# Patient Record
Sex: Female | Born: 1990 | Race: White | Hispanic: No | Marital: Single | Smoking: Never smoker
Health system: Southern US, Community
[De-identification: ages and names within clinical notes are randomized; demographics above are authoritative.]

## PROBLEM LIST (undated history)

## (undated) DIAGNOSIS — F329 Major depressive disorder, single episode, unspecified: Secondary | ICD-10-CM

## (undated) DIAGNOSIS — J349 Unspecified disorder of nose and nasal sinuses: Secondary | ICD-10-CM

## (undated) DIAGNOSIS — F32A Depression, unspecified: Secondary | ICD-10-CM

## (undated) DIAGNOSIS — F419 Anxiety disorder, unspecified: Secondary | ICD-10-CM

## (undated) HISTORY — PX: OTHER SURGICAL HISTORY: SHX169

---

## 2013-10-29 ENCOUNTER — Encounter (HOSPITAL_COMMUNITY): Payer: Self-pay | Admitting: Emergency Medicine

## 2013-10-29 ENCOUNTER — Emergency Department (HOSPITAL_COMMUNITY)
Admission: EM | Admit: 2013-10-29 | Discharge: 2013-10-30 | Disposition: A | Discharge status: Home / Self Care | Payer: No Typology Code available for payment source | Attending: Emergency Medicine | Admitting: Emergency Medicine

## 2013-10-29 DIAGNOSIS — R51 Headache: Secondary | ICD-10-CM | POA: Diagnosis not present

## 2013-10-29 DIAGNOSIS — J320 Chronic maxillary sinusitis: Secondary | ICD-10-CM

## 2013-10-29 DIAGNOSIS — R519 Headache, unspecified: Secondary | ICD-10-CM

## 2013-10-29 DIAGNOSIS — Z8659 Personal history of other mental and behavioral disorders: Secondary | ICD-10-CM | POA: Diagnosis not present

## 2013-10-29 DIAGNOSIS — J0100 Acute maxillary sinusitis, unspecified: Secondary | ICD-10-CM | POA: Insufficient documentation

## 2013-10-29 HISTORY — DX: Anxiety disorder, unspecified: F41.9

## 2013-10-29 HISTORY — DX: Major depressive disorder, single episode, unspecified: F32.9

## 2013-10-29 HISTORY — DX: Depression, unspecified: F32.A

## 2013-10-29 NOTE — ED Notes (Signed)
Pt is c/o sinus problems esp on the left under her eye  Pt states it has been bothering her for the past 2 months

## 2013-10-29 NOTE — ED Provider Notes (Signed)
CSN: [id number]     Arrival date & time 10/29/13  2230 History   First MD Initiated Contact with Patient 10/29/13 2348     Chief Complaint  Patient presents with  . Facial Pain     (Consider location/radiation/quality/duration/timing/severity/associated sxs/prior Treatment) The history is provided by the patient.   23 year old female comes in with left malar pain for the last 2 months. Pain is dull pressure and constant. She relates that 3/10. She thinks it is a sinus infection but there's been no nasal congestion or drainage. She was seen at an urgent care center and put on a course of Augmentin with no improvement. She went to an ENT physician who put her on a different antibiotic which seemed to improve her general physical state but did not really affecting her pressure. On completing a course of antibiotics, all symptoms reverted to baseline. She has noted that she is not able to concentrate as well as she had before and she feels generally fatigued. She had a previous episode of sinus infection which required 3 courses of antibiotics. She denies fever or chills. There's been no cough. He denies sore throat or ear pain.  Past Medical History  Diagnosis Date  . Depression   . Anxiety    Past Surgical History  Procedure Laterality Date  . Extraction of wisdom teeth     Family History  Problem Relation Age of Onset  . Heart Problems Mother   . Depression Other   . Heart Problems Other   . Cancer Other    History  Substance Use Topics  . Smoking status: Never Smoker   . Smokeless tobacco: Not on file  . Alcohol Use: Yes   OB History   Grav Para Term Preterm Abortions TAB SAB Ect Mult Living                 Review of Systems  All other systems reviewed and are negative.     Allergies  Review of patient's allergies indicates no known allergies.  Home Medications   Prior to Admission medications   Not on File   BP 130/79  Pulse 92  Temp(Src) 98.8 F (37.1 C)  (Oral)  Resp 18  SpO2 100%  LMP 10/04/2013 Physical Exam  Nursing note and vitals reviewed.  23 year old female, resting comfortably and in no acute distress. Vital signs are normal. Oxygen saturation is 100%, which is normal. Head is normocephalic and atraumatic. PERRLA, EOMI. Oropharynx is clear. Nasal cavity appears normal. Nasal septum is in the midline. There is no edema around the turbinates and no drainage seen. There is mild tenderness to palpation in the left maxillary sinus but no tenderness on the right or over the frontal sinuses. Neck is nontender and supple without adenopathy or JVD. Back is nontender and there is no CVA tenderness. Lungs are clear without rales, wheezes, or rhonchi. Chest is nontender. Heart has regular rate and rhythm without murmur. Abdomen is soft, flat, nontender without masses or hepatosplenomegaly and peristalsis is normoactive. Extremities have no cyanosis or edema, full range of motion is present. Skin is warm and dry without rash. Neurologic: Mental status is normal, cranial nerves are intact, there are no motor or sensory deficits.  ED Course  Procedures (including critical care time)  Imaging Review Ct Maxillofacial Wo Cm  10/30/2013   CLINICAL DATA:  23 year old female with facial pain and sinus disease. Initial encounter.  EXAM: CT MAXILLOFACIAL WITHOUT CONTRAST  TECHNIQUE: Multidetector CT imaging of  the maxillofacial structures was performed. Multiplanar CT image reconstructions were also generated. A small metallic BB was placed on the right temple in order to reliably differentiate right from left.  COMPARISON:  None.  FINDINGS: Negative visualized non contrast brain parenchyma, orbits soft tissues, scalp soft tissues, and visible non contrast deep soft tissue spaces of the face.  Tympanic cavities and mastoids are clear.  Sphenoid sinuses are clear.  Ethmoid air cells are clear.  Frontal sinuses are clear.  Right maxillary sinus is clear.   Mild mucosal thickening and trace bubbly opacity in the anterior left maxillary sinus. This does affect the left OMC.  Mild nasal septal deviation. No acute osseous abnormality identified.  IMPRESSION: Minimal to mild left maxillary sinus inflammatory changes.   Electronically Signed   By: Augusto GambleLee  Hall M.D.   On: 10/30/2013 00:47   Images viewed by me.  MDM   Final diagnoses:  Facial pain  Left maxillary sinusitis    Left sided facial pain of uncertain cause. She will be sent for a sinus CT scan to evaluate whether she actually does have an acute sinusitis.  CT shows mild inflammatory changes in the left maxillary sinus. This does correlate with where she has having her pain but the CT scan is rather unimpressive. Since she's already had 2 courses of antibiotics, it is elected to refer her back to her ENT physician who can make decision at that time whether to give her an additional course of antibiotics. This was discussed with patient she expresses understanding.  Dione Boozeavid Altamese Deguire, MD 10/30/13 [phone]

## 2013-10-30 ENCOUNTER — Emergency Department (HOSPITAL_COMMUNITY): Payer: No Typology Code available for payment source

## 2013-10-30 NOTE — Discharge Instructions (Signed)
Make an appointment with your ENT physician.  Sinusitis Sinusitis is redness, soreness, and inflammation of the paranasal sinuses. Paranasal sinuses are air pockets within the bones of your face (beneath the eyes, the middle of the forehead, or above the eyes). In healthy paranasal sinuses, mucus is able to drain out, and air is able to circulate through them by way of your nose. However, when your paranasal sinuses are inflamed, mucus and air can become trapped. This can allow bacteria and other germs to grow and cause infection. Sinusitis can develop quickly and last only a short time (acute) or continue over a long period (chronic). Sinusitis that lasts for more than 12 weeks is considered chronic.  CAUSES  Causes of sinusitis include:  Allergies.  Structural abnormalities, such as displacement of the cartilage that separates your nostrils (deviated septum), which can decrease the air flow through your nose and sinuses and affect sinus drainage.  Functional abnormalities, such as when the small hairs (cilia) that line your sinuses and help remove mucus do not work properly or are not present. SIGNS AND SYMPTOMS  Symptoms of acute and chronic sinusitis are the same. The primary symptoms are pain and pressure around the affected sinuses. Other symptoms include:  Upper toothache.  Earache.  Headache.  Bad breath.  Decreased sense of smell and taste.  A cough, which worsens when you are lying flat.  Fatigue.  Fever.  Thick drainage from your nose, which often is green and may contain pus (purulent).  Swelling and warmth over the affected sinuses. DIAGNOSIS  Your health care provider will perform a physical exam. During the exam, your health care provider may:  Look in your nose for signs of abnormal growths in your nostrils (nasal polyps).  Tap over the affected sinus to check for signs of infection.  View the inside of your sinuses (endoscopy) using an imaging device that  has a light attached (endoscope). If your health care provider suspects that you have chronic sinusitis, one or more of the following tests may be recommended:  Allergy tests.  Nasal culture. A sample of mucus is taken from your nose, sent to a lab, and screened for bacteria.  Nasal cytology. A sample of mucus is taken from your nose and examined by your health care provider to determine if your sinusitis is related to an allergy. TREATMENT  Most cases of acute sinusitis are related to a viral infection and will resolve on their own within 10 days. Sometimes medicines are prescribed to help relieve symptoms (pain medicine, decongestants, nasal steroid sprays, or saline sprays).  However, for sinusitis related to a bacterial infection, your health care provider will prescribe antibiotic medicines. These are medicines that will help kill the bacteria causing the infection.  Rarely, sinusitis is caused by a fungal infection. In theses cases, your health care provider will prescribe antifungal medicine. For some cases of chronic sinusitis, surgery is needed. Generally, these are cases in which sinusitis recurs more than 3 times per year, despite other treatments. HOME CARE INSTRUCTIONS   Drink plenty of water. Water helps thin the mucus so your sinuses can drain more easily.  Use a humidifier.  Inhale steam 3 to 4 times a day (for example, sit in the bathroom with the shower running).  Apply a warm, moist washcloth to your face 3 to 4 times a day, or as directed by your health care provider.  Use saline nasal sprays to help moisten and clean your sinuses.  Take medicines only  as directed by your health care provider.  If you were prescribed either an antibiotic or antifungal medicine, finish it all even if you start to feel better. SEEK IMMEDIATE MEDICAL CARE IF:  You have increasing pain or severe headaches.  You have nausea, vomiting, or drowsiness.  You have swelling around your  face.  You have vision problems.  You have a stiff neck.  You have difficulty breathing. MAKE SURE YOU:   Understand these instructions.  Will watch your condition.  Will get help right away if you are not doing well or get worse. Document Released: 12/25/2004 Document Revised: 05/11/2013 Document Reviewed: 01/09/2011 [facility] Patient Information 2015 [employer] [place]. This information is not intended to replace advice given to you by your health care provider. Make sure you discuss any questions you have with your health care provider.

## 2013-11-04 ENCOUNTER — Emergency Department (HOSPITAL_COMMUNITY): Payer: PRIVATE HEALTH INSURANCE

## 2013-11-04 ENCOUNTER — Emergency Department (HOSPITAL_COMMUNITY)
Admission: EM | Admit: 2013-11-04 | Discharge: 2013-11-04 | Disposition: A | Discharge status: Home / Self Care | Payer: PRIVATE HEALTH INSURANCE | Attending: Emergency Medicine | Admitting: Emergency Medicine

## 2013-11-04 ENCOUNTER — Encounter (HOSPITAL_COMMUNITY): Payer: Self-pay | Admitting: Emergency Medicine

## 2013-11-04 DIAGNOSIS — R6883 Chills (without fever): Secondary | ICD-10-CM

## 2013-11-04 DIAGNOSIS — F329 Major depressive disorder, single episode, unspecified: Secondary | ICD-10-CM | POA: Diagnosis not present

## 2013-11-04 DIAGNOSIS — R69 Illness, unspecified: Secondary | ICD-10-CM

## 2013-11-04 DIAGNOSIS — F419 Anxiety disorder, unspecified: Secondary | ICD-10-CM | POA: Insufficient documentation

## 2013-11-04 DIAGNOSIS — J111 Influenza due to unidentified influenza virus with other respiratory manifestations: Secondary | ICD-10-CM | POA: Diagnosis not present

## 2013-11-04 DIAGNOSIS — R05 Cough: Secondary | ICD-10-CM | POA: Diagnosis present

## 2013-11-04 DIAGNOSIS — Z79899 Other long term (current) drug therapy: Secondary | ICD-10-CM | POA: Diagnosis not present

## 2013-11-04 DIAGNOSIS — Z793 Long term (current) use of hormonal contraceptives: Secondary | ICD-10-CM | POA: Diagnosis not present

## 2013-11-04 MED ORDER — ACETAMINOPHEN 500 MG PO TABS
1000.0000 mg | ORAL_TABLET | Freq: Once | ORAL | Status: AC
  Administered 2013-11-04: 1000 mg via ORAL
  Filled 2013-11-04: qty 2

## 2013-11-04 MED ORDER — ONDANSETRON 4 MG PO TBDP: ORAL_TABLET | ORAL | Status: AC

## 2013-11-04 MED ORDER — ONDANSETRON 4 MG PO TBDP
4.0000 mg | ORAL_TABLET | Freq: Once | ORAL | Status: AC
  Administered 2013-11-04: 4 mg via ORAL
  Filled 2013-11-04: qty 1

## 2013-11-04 MED ORDER — SODIUM CHLORIDE 0.9 % IV BOLUS (SEPSIS)
1000.0000 mL | Freq: Once | INTRAVENOUS | Status: AC
  Administered 2013-11-04: 1000 mL via INTRAVENOUS

## 2013-11-04 MED ORDER — KETOROLAC TROMETHAMINE 10 MG PO TABS: 10.0000 mg | ORAL_TABLET | Freq: Four times a day (QID) | ORAL | Status: AC | PRN

## 2013-11-04 MED ORDER — ONDANSETRON HCL 4 MG/2ML IJ SOLN
4.0000 mg | Freq: Once | INTRAMUSCULAR | Status: AC
  Administered 2013-11-04: 4 mg via INTRAVENOUS
  Filled 2013-11-04: qty 2

## 2013-11-04 MED ORDER — KETOROLAC TROMETHAMINE 30 MG/ML IJ SOLN
30.0000 mg | Freq: Once | INTRAMUSCULAR | Status: AC
  Administered 2013-11-04: 30 mg via INTRAVENOUS
  Filled 2013-11-04: qty 1

## 2013-11-04 MED ORDER — GI COCKTAIL ~~LOC~~
30.0000 mL | Freq: Once | ORAL | Status: AC
  Administered 2013-11-04: 30 mL via ORAL
  Filled 2013-11-04: qty 30

## 2013-11-04 NOTE — ED Notes (Signed)
Pt able to tolerate PO challenge

## 2013-11-04 NOTE — ED Notes (Signed)
Bed: WA07 Expected date:  Expected time:  Means of arrival:  Comments: EMS/fever/cough

## 2013-11-04 NOTE — ED Provider Notes (Signed)
CSN: [id number]     Arrival date & time 11/04/13  1810 History   First MD Initiated Contact with Patient 11/04/13 1813     Chief Complaint  Patient presents with  . Fever  . Cough     (Consider location/radiation/quality/duration/timing/severity/associated sxs/prior Treatment) Patient is a 23 y.o. female presenting with fever, cough, and general illness. The history is provided by the patient.  Fever Associated symptoms: cough, headaches and myalgias   Associated symptoms: no chest pain, no rash and no rhinorrhea   Cough Associated symptoms: headaches and myalgias   Associated symptoms: no chest pain, no fever, no rash and no rhinorrhea   Illness Location:  Diffuse Quality:  Myalgias, nausea Severity:  Moderate Onset quality:  Sudden Timing:  Constant Progression:  Unchanged Chronicity:  New Context:  Spontaneously Relieved by:  Nothing Worsened by:  Nothing Associated symptoms: cough, headaches, loss of consciousness and myalgias   Associated symptoms: no abdominal pain, no chest pain, no fatigue, no fever, no rash and no rhinorrhea     Past Medical History  Diagnosis Date  . Depression   . Anxiety    Past Surgical History  Procedure Laterality Date  . Extraction of wisdom teeth     Family History  Problem Relation Age of Onset  . Heart Problems Mother   . Depression Other   . Heart Problems Other   . Cancer Other    History  Substance Use Topics  . Smoking status: Never Smoker   . Smokeless tobacco: Not on file  . Alcohol Use: Yes   OB History   Grav Para Term Preterm Abortions TAB SAB Ect Mult Living                 Review of Systems  Constitutional: Negative for fever and fatigue.  HENT: Negative for rhinorrhea.   Respiratory: Positive for cough.   Cardiovascular: Negative for chest pain.  Gastrointestinal: Negative for abdominal pain.  Musculoskeletal: Positive for myalgias.  Skin: Negative for rash.  Neurological: Positive for loss of  consciousness and headaches.  All other systems reviewed and are negative.     Allergies  Review of patient's allergies indicates no known allergies.  Home Medications   Prior to Admission medications   Medication Sig Start Date End Date Taking? Authorizing Provider  calcium carbonate (OS-CAL) 600 MG TABS tablet Take 600 mg by mouth daily with breakfast.   Yes Historical Provider, MD  Cholecalciferol (VITAMIN D3) 5000 UNITS CAPS Take 1 capsule by mouth daily.   Yes Historical Provider, MD  FLUoxetine (PROZAC) 20 MG capsule Take 60 mg by mouth daily.   Yes Historical Provider, MD  norethindrone-ethinyl estradiol (MICROGESTIN,JUNEL,LOESTRIN) 1-20 MG-MCG tablet Take 1 tablet by mouth daily.   Yes Historical Provider, MD  pseudoephedrine (SUDAFED) 120 MG 12 hr tablet Take 120 mg by mouth 2 (two) times daily.   Yes Historical Provider, MD   BP 116/59  Pulse 60  Temp(Src) 98.6 F (37 C) (Oral)  Resp 18  SpO2 95%  LMP 10/04/2013 Physical Exam  Nursing note and vitals reviewed. Constitutional: She is oriented to person, place, and time. She appears well-developed and well-nourished. No distress.  HENT:  Head: Normocephalic and atraumatic.  Mouth/Throat: Oropharynx is clear and moist.  Eyes: EOM are normal. Pupils are equal, round, and reactive to light.  Neck: Normal range of motion. Neck supple.  Cardiovascular: Normal rate and regular rhythm.  Exam reveals no friction rub.   No murmur heard. Pulmonary/Chest: Effort normal  and breath sounds normal. No respiratory distress. She has no wheezes. She has no rales.  Abdominal: Soft. She exhibits no distension. There is no tenderness. There is no rebound.  Musculoskeletal: Normal range of motion. She exhibits no edema.  Neurological: She is alert and oriented to person, place, and time.  Skin: She is not diaphoretic.    ED Course  Procedures (including critical care time) Labs Revieuw Labs Reviewed - No data to display  Imaging  Review Dg Chest 2 View  11/04/2013   CLINICAL DATA:  Fever. Chills. Nausea and vomiting. Duration: Today.  EXAM: CHEST  2 VIEW  COMPARISON:  None.  FINDINGS: The lungs appear clear.  Cardiac and mediastinal contours normal.  No pleural effusion identified.  IMPRESSION: No active cardiopulmonary disease.   Electronically Signed   By: Herbie BaltimoreWalt  Liebkemann M.D.   On: 11/04/2013 19:20     EKG Interpretation None      MDM   Final diagnoses:  Chills  Influenza-like illness    22F presents with nausea, myalgias, headaches, chills. No fever here. Recent sinusitis, has had 2 courses of antibiotics without relief, was evaluated 6 days ago in the ER, no antibiotics given at that time. Woke up this morning with symptoms. No vomiting, no cough, no sore throat, no URI symptoms. AFVSS here. On exam, neck supple, no meningeal signs. Abdomen benign.  Will treat with nausea meds, fluids, toradol. Concern for possible flu-like illness. Feeling better after fluids, nausea meds. She is tolerating PO. Given zofran, toradol. Stable for discharge.  Elwin MochaBlair Sayeed Weatherall, MD 11/04/13 2231

## 2013-11-04 NOTE — ED Notes (Signed)
Per EMS-pt c/o of flu like symptoms, fever/chills. Nausea, vomiting x2.

## 2013-11-04 NOTE — Discharge Instructions (Signed)
Influenza Influenza ("the flu") is a viral infection of the respiratory tract. It occurs more often in winter months because people spend more time in close contact with one another. Influenza can make you feel very sick. Influenza easily spreads from person to person (contagious). CAUSES  Influenza is caused by a virus that infects the respiratory tract. You can catch the virus by breathing in droplets from an infected person's cough or sneeze. You can also catch the virus by touching something that was recently contaminated with the virus and then touching your mouth, nose, or eyes. RISKS AND COMPLICATIONS You may be at risk for a more severe case of influenza if you smoke cigarettes, have diabetes, have chronic heart disease (such as heart failure) or lung disease (such as asthma), or if you have a weakened immune system. Elderly people and pregnant women are also at risk for more serious infections. The most common problem of influenza is a lung infection (pneumonia). Sometimes, this problem can require emergency medical care and may be life threatening. SIGNS AND SYMPTOMS  Symptoms typically last 4 to 10 days and may include:  Fever.  Chills.  Headache, body aches, and muscle aches.  Sore throat.  Chest discomfort and cough.  Poor appetite.  Weakness or feeling tired.  Dizziness.  Nausea or vomiting. DIAGNOSIS  Diagnosis of influenza is often made based on your history and a physical exam. A nose or throat swab test can be done to confirm the diagnosis. TREATMENT  In mild cases, influenza goes away on its own. Treatment is directed at relieving symptoms. For more severe cases, your health care provider may prescribe antiviral medicines to shorten the sickness. Antibiotic medicines are not effective because the infection is caused by a virus, not by bacteria. HOME CARE INSTRUCTIONS  Take medicines only as directed by your health care provider.  Use a cool mist humidifier to make  breathing easier.  Get plenty of rest until your temperature returns to normal. This usually takes 3 to 4 days.  Drink enough fluid to keep your urine clear or pale yellow.  Cover yourmouth and nosewhen coughing or sneezing,and wash your handswellto prevent thevirusfrom spreading.  Stay homefromwork orschool untilthe fever is gonefor at least 30full day. PREVENTION  An annual influenza vaccination (flu shot) is the best way to avoid getting influenza. An annual flu shot is now routinely recommended for all adults in the [place] IF:  You experiencechest pain, yourcough worsens,or you producemore mucus.  Youhave nausea,vomiting, ordiarrhea.  Your fever returns or gets worse. SEEK IMMEDIATE MEDICAL CARE IF:  You havetrouble breathing, you become short of breath,or your skin ornails becomebluish.  You have severe painor stiffnessin the neck.  You develop a sudden headache, or pain in the face or ear.  You have nausea or vomiting that you cannot control. MAKE SURE YOU:   Understand these instructions.  Will watch your condition.  Will get help right away if you are not doing well or get worse. Document Released: 12/23/1999 Document Revised: 05/11/2013 Document Reviewed: 03/26/2011 [facility] Patient Information 2015 [place], [place]. This information is not intended to replace advice given to you by your health care provider. Make sure you discuss any questions you have with your health care provider.   Emergency Department Resource Guide 1) Find a Doctor and Pay Out of Pocket Although you won't have to find out who is covered by your insurance plan, it is a good idea to ask around and get recommendations. You  will then need to call the office and see if the doctor you have chosen will accept you as a new patient and what types of options they offer for patients who are self-pay. Some doctors offer discounts or will set up payment plans for  their patients who do not have insurance, but you will need to ask so you aren't surprised when you get to your appointment.  2) Contact Your Local Health Department Not all health departments have doctors that can see patients for sick visits, but many do, so it is worth a call to see if yours does. If you don't know where your local health department is, you can check in your phone book. The CDC also has a tool to help you locate your state's health department, and many state websites also have listings of all of their local health departments.  3) Find a [facility] If your illness is not likely to be very severe or complicated, you may want to try a walk in clinic. These are popping up all over the country in pharmacies, drugstores, and shopping centers. They're usually staffed by nurse practitioners or physician assistants that have been trained to treat common illnesses and complaints. They're usually fairly quick and inexpensive. However, if you have serious medical issues or chronic medical problems, these are probably not your best option.  No Primary Care Doctor: - Call Health Connect at  [phone] - they can help you locate a primary care doctor that  accepts your insurance, provides certain services, etc. - Physician Referral Service- [phone]  Chronic Pain Problems: Organization         Address  Phone   Notes  [facility]  [phone] Patients need to be referred by their primary care doctor.   Medication Assistance: Organization         Address  Phone   Notes  [facility] Medication [facility] [address] [place], [place] [address] [phone] --Must be a resident of [facility] -- Must have NO insurance coverage whatsoever (no Medicaid/ Medicare, etc.) -- The pt. MUST have a primary care doctor that directs their care regularly and follows them in the community   MedAssist  [phone]   [employer]  [phone]    Agencies that provide inexpensive medical care: Organization         Address  Phone   Notes  [place]  [phone]   [facility]    [phone]   [facility] [place], [place] [address] [phone]   [place] [address], [place] [phone]   Planned Parenthood    [phone]   [facility]    [phone]   [place] and [place], [place] Phone:  [phone], Fax:  [phone] Hours of Operation:  9 am - 6 pm, M-F.  Also accepts Medicaid/Medicare and self-pay.  [facility] [place], [address], [place] Phone: [phone], Fax: [phone]. Hours of Operation:  8:30 am - 5:30 pm, M-F.  Also accepts Medicaid and self-pay.  [facility] [place] [address] [place] Phone: [phone]   [place], [place], [place] [phone], Ext. [phone] Mondays & Thursdays: 7-9 AM.  First 15 patients are seen on a first come, first  serve basis.    [place] Providers:  Organization         Address  Phone   Notes  [facility] [address], [place] [phone] Also accepts self-pay patients.  [facility] [address], [place]  [phone]   [place], [address], [place] [phone]   [facility] [address], [place] [phone]   Lucianne Lei [address], [place]   [phone] Only accepts [place] Access [place] patients after they have their name applied to their card.   Self-Pay (no insurance) in [facility]:  Organization         Address  Phone   Notes  Sickle Cell Patients, [facility] Internal [facility] [place] [phone]   [facility] [place] [phone]   [facility] [place]  [place], [place], [place] [phone]   [facility]/Dr. Osei-Bonsu  [address], [place] or [address], [place] [phone] Phone number for both [place] and [place] locations is the same.  [facility] [address] [place] [phone]   [facility] [address] [place] or [address] [phone]   [facility] [address] [place] [phone], phone; [phone], fax Sees patients 1st and 3rd Saturday of every month.  Must not qualify for public or private insurance (i.e. Medicaid, Medicare, [place] Health Choice, Veterans' Benefits)  Household income should be no more than 200% of the poverty level The clinic cannot treat you if you are pregnant or think you are pregnant  Sexually transmitted diseases are not treated at the clinic.    Dental Care: Organization         Address  Phone  Notes  [facility] [place] [phone] Accepts children up to age 25 who are enrolled in [place] or [place]; pregnant women with a Medicaid card; and children who have applied for Medicaid or [place] Health Choice, but were declined, whose parents can pay a reduced fee at time of service.  [facility] Department of [facility]  [address], [place] [phone] Accepts children up to age 62 who are enrolled in [place] or [place]; pregnant women with a Medicaid card; and children who have applied for Medicaid or [place] Health Choice, but were declined, whose parents can pay a reduced fee at time of service.  [place] Adult Dental Access PROGRAM  [place] [phone] Patients are  seen by appointment only. Walk-ins are not accepted. [place] will see patients 60 years of age and older. Monday - Tuesday (8am-5pm) Most Wednesdays (8:30-5pm) $30 per visit, cash only  [facility] Adult Dental Access PROGRAM  [address], [facility] [phone] Patients are seen by appointment only. Walk-ins are not accepted. [place] will see patients 21 years of age and older. One Wednesday Evening (Monthly: Volunteer Based).  $30 per visit, cash only  [place]  [phone] for adults; Children under age 84, call Graduate Pediatric Dentistry at [phone]. Children aged 35-14, please call (  [phone]) [phone] to request a pediatric application.  Dental services are provided in all areas of dental care including fillings, crowns and bridges, complete and partial dentures, implants, gum treatment, root canals, and extractions. Preventive care is also provided. Treatment is provided to both adults and children. Patients are selected via a lottery and there is often a waiting list.   [facility] [address], [place]  [phone] [email or web address]   [facility] [address] [place], [place] [phone], Ext. [phone] Second and Fourth Thursday of each month, opens at 6:30 AM; Clinic ends at 9 AM.  Patients are seen on a first-come first-served basis, and a limited number are seen during each clinic.   [facility]  [address] Hillard Danker [place], [place] [phone]   Eligibility Requirements You must have lived in [place], [place], or [place] counties for at least the last three months.   You cannot be eligible for state or federal sponsored [employer], including [employer], [place], or [employer].   You generally cannot be eligible for healthcare insurance through your employer.    How to apply: Eligibility screenings are held every Tuesday and Wednesday afternoon from 1:00 pm until 4:00  pm. You do not need an appointment for the interview!  [facility] [address] [place], [place]   [place]  [place] Department  [place]  [phone]    Behavioral Health Resources in the Community: Intensive Outpatient Programs Organization         Address  Phone  Notes  Seffner Maish Vaya. [address], [place], [place] [phone]   [facility] [address], [place], [place]   ADS: Alcohol & Drug Svcs [address] [place], [place]   [place] [address]  [place], [place] or [phone]   Substance Abuse Resources Organization         Address  Phone  Notes  Alcohol and Drug Services  [phone]   [place]  [phone]   [facility] [place]   Chinita Pester  [phone]   Residential & Outpatient Substance Abuse Program  [phone]   Psychological Services Organization         Address  Phone  Notes  [facility] [place]  [place]  [phone]   [place] [address], [place] or [phone]    Mobile Crisis Teams Organization         Address  Phone  Notes  [facility], Mobile Crisis Care Unit  [phone]   [facility]  [address] [place], [place]   [place] Levels [address] [place] [phone]    Self-Help/Support Groups Organization         Address  Phone             Notes  [place]. of [place] - variety of support groups  [place] Call for more information  Narcotics Anonymous (NA), [facility] [address], [employer] [place]  2 meetings at this location   Special educational needs teacher          Address  Phone  Notes  [facility] [place],    [place]  [phone]   [place]  [id number], [place], [place] [phone]   [facility] [address], [place] [phone] Admissions: 8am-3pm M-F  [facility] [address]    [place], [place] [phone]   [facility] [address], [place], [place] [phone]   [facility] [address]  [place], [place] [phone]   [facility] - Intensive Outpatient [address], [place], [place] [phone]   [facility] (Addiction Recovery Care [facility] [address]  [place], [place] [phone] or [phone]   [facility] Services (RTS) [address] [place], [place] [phone] Accepts Medicaid  [facility] [address]  [place] [phone] Substance Abuse/Addiction Treatment   [facility] Behavioral Health Resources Organization         Address  Phone  Notes  [facility]  [phone]) [phone]   Angie FavaJulie Brannon, PhD [address] [place], [place]   [phone] or [phone]   [facility]   [address] [place], [place] [phone]   [facility] [address], [place], [place] [phone]) [phone] Insurance/Medicaid/sponsorship through [facility]  [facility] [address]                                    [place], [place] [phone]) [phone] Therapy/tele-psych/case  [facility] [address] [place] [address]   [place], [place] [phone]    Dr. Lolly MustacheArfeen  [phone]   [facility]  [employer] [facility] 1) [address], [place] 2) [address], [place] 3)  [address], [place] [phone](336) [phone]  [phone](336) [phone]   [facility] Child Abuse Hotline [phone](336) [phone] or [phone]) [phone] (After Hours)

## 2013-11-19 ENCOUNTER — Encounter (HOSPITAL_COMMUNITY): Payer: Self-pay | Admitting: *Deleted

## 2013-11-19 ENCOUNTER — Emergency Department (HOSPITAL_COMMUNITY)
Admission: EM | Admit: 2013-11-19 | Discharge: 2013-11-20 | Disposition: A | Discharge status: Home / Self Care | Payer: PRIVATE HEALTH INSURANCE | Attending: Emergency Medicine | Admitting: Emergency Medicine

## 2013-11-19 DIAGNOSIS — J320 Chronic maxillary sinusitis: Secondary | ICD-10-CM | POA: Diagnosis not present

## 2013-11-19 DIAGNOSIS — Z79899 Other long term (current) drug therapy: Secondary | ICD-10-CM | POA: Insufficient documentation

## 2013-11-19 DIAGNOSIS — F329 Major depressive disorder, single episode, unspecified: Secondary | ICD-10-CM | POA: Diagnosis not present

## 2013-11-19 DIAGNOSIS — R51 Headache: Secondary | ICD-10-CM | POA: Diagnosis present

## 2013-11-19 DIAGNOSIS — F419 Anxiety disorder, unspecified: Secondary | ICD-10-CM | POA: Insufficient documentation

## 2013-11-19 HISTORY — DX: Unspecified disorder of nose and nasal sinuses: J34.9

## 2013-11-19 MED ORDER — FLUTICASONE PROPIONATE 50 MCG/ACT NA SUSP
2.0000 | Freq: Every day | NASAL | Status: AC
Start: 2013-11-19 — End: ?

## 2013-11-19 MED ORDER — HYDROCODONE-ACETAMINOPHEN 5-325 MG PO TABS: 1.0000 | ORAL_TABLET | Freq: Four times a day (QID) | ORAL | Status: AC | PRN

## 2013-11-19 NOTE — Discharge Instructions (Signed)

## 2013-11-19 NOTE — ED Notes (Signed)
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

## 2013-11-19 NOTE — ED Provider Notes (Signed)
CSN: [id number]     Arrival date & time 11/19/13  2244 History   First MD Initiated Contact with Patient 11/19/13 2325     Chief Complaint  Patient presents with  . Facial Pain     (Consider location/radiation/quality/duration/timing/severity/associated sxs/prior Treatment) HPI  This is a 23 year old female who presents with sinus pressure. Patient reports chronic sinus issues over the last 2-3 months. She was diagnosed with a blockage on CT scan and has sent an ENT. She is trying to schedule surgery but states "it can't happen for weeks." She reports persistent left-sided headache and difficulty breathing out of her left nare.  Patient reports that she is having difficulty breathing at night. She is taking Sudafed q12 hours and nasal saline. She's not currently on any nasal steroids. She denies any fevers.  Past Medical History  Diagnosis Date  . Depression   . Anxiety   . Sinus disease    Past Surgical History  Procedure Laterality Date  . Extraction of wisdom teeth     Family History  Problem Relation Age of Onset  . Heart Problems Mother   . Depression Other   . Heart Problems Other   . Cancer Other    History  Substance Use Topics  . Smoking status: Never Smoker   . Smokeless tobacco: Not on file  . Alcohol Use: Yes   OB History    No data available     Review of Systems  Constitutional: Negative for fever.  HENT: Positive for sinus pressure.   Respiratory: Negative for chest tightness and shortness of breath.   Cardiovascular: Negative for chest pain.  Gastrointestinal: Negative for nausea and vomiting.  Neurological: Positive for headaches.  All other systems reviewed and are negative.     Allergies  Review of patient's allergies indicates no known allergies.  Home Medications   Prior to Admission medications   Medication Sig Start Date End Date Taking? Authorizing Provider  calcium carbonate (OS-CAL) 600 MG TABS tablet Take 600 mg by mouth daily  with breakfast.   Yes Historical Provider, MD  Cholecalciferol (VITAMIN D3) 5000 UNITS CAPS Take 5,000 Units by mouth daily.    Yes Historical Provider, MD  FLUoxetine (PROZAC) 20 MG capsule Take 60 mg by mouth daily.   Yes Historical Provider, MD  norethindrone-ethinyl estradiol (MICROGESTIN,JUNEL,LOESTRIN) 1-20 MG-MCG tablet Take 1 tablet by mouth daily.   Yes Historical Provider, MD  pseudoephedrine (SUDAFED) 120 MG 12 hr tablet Take 120 mg by mouth 2 (two) times daily.   Yes Historical Provider, MD  sodium chloride (OCEAN) 0.65 % SOLN nasal spray Place 1 spray into both nostrils 2 (two) times daily as needed for congestion.   Yes Historical Provider, MD  fluticasone (FLONASE) 50 MCG/ACT nasal spray Place 2 sprays into both nostrils daily. 11/19/13   Shon Batonourtney F Horton, MD  HYDROcodone-acetaminophen (NORCO/VICODIN) 5-325 MG per tablet Take 1 tablet by mouth every 6 (six) hours as needed for moderate pain or severe pain. 11/19/13   Shon Batonourtney F Horton, MD  ketorolac (TORADOL) 10 MG tablet Take 1 tablet (10 mg total) by mouth every 6 (six) hours as needed. 11/04/13   Elwin MochaBlair Walden, MD  ondansetron (ZOFRAN ODT) 4 MG disintegrating tablet 4mg  ODT q6 hours prn nausea/vomit 11/04/13   Elwin MochaBlair Walden, MD   BP 125/107 mmHg  Pulse 89  Temp(Src) 97.4 F (36.3 C) (Oral)  Resp 16  SpO2 99%  LMP 11/03/2013 Physical Exam  Constitutional: She is oriented to person, place, and time.  She appears well-developed and well-nourished. No distress.  HENT:  Head: Normocephalic and atraumatic.  Mouth/Throat: Oropharynx is clear and moist.  Tenderness to palpation over the left maxillary sinus  Eyes: Pupils are equal, round, and reactive to light.  Neck: Neck supple.  Cardiovascular: Normal rate, regular rhythm and normal heart sounds.   No murmur heard. Pulmonary/Chest: Effort normal. No respiratory distress. She has no wheezes.  Neurological: She is alert and oriented to person, place, and time.  Skin: Skin is  warm and dry.  Psychiatric: She has a normal mood and affect.  Nursing note and vitals reviewed.   ED Course  Procedures (including critical care time) Labs Review Labs Reviewed - No data to display  Imaging Review No results found.   EKG Interpretation None      MDM   Final diagnoses:  Chronic maxillary sinusitis    Patient presents with symptoms of her chronic sinusitis and persistent headache. Patient is requesting more urgent ENT evaluation and surgery. Discussed with patient that she is R the been seen as an outpatient and given that this is not an emergent situation, I will not be able to help her arrange for more emergent surgery. I will however try to optimize her treatment with Flonase. Patient was also given Vicodin for persistent headache and difficulty sleeping.  Patient encouraged to contact her ENT today to discuss expediting care.  After history, exam, and medical workup I feel the patient has been appropriately medically screened and is safe for discharge home. Pertinent diagnoses were discussed with the patient. Patient was given return precautions.     Shon Batonourtney F Horton, MD 11/20/13 [phone]

## 2014-11-11 ENCOUNTER — Ambulatory Visit (HOSPITAL_COMMUNITY)
Admission: AD | Admit: 2014-11-11 | Discharge: 2014-11-11 | Disposition: A | Discharge status: Home / Self Care | Payer: No Typology Code available for payment source | Attending: Psychiatry | Admitting: Psychiatry
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
## 2015-01-13 ENCOUNTER — Ambulatory Visit (HOSPITAL_COMMUNITY): Payer: Self-pay | Admitting: Psychiatry

## 2015-09-11 IMAGING — CT CT MAXILLOFACIAL W/O CM
1 series · 16 of 30 positions shown, 20 images · non-contrast
Comparison: None.

CLINICAL DATA: 23-year-old female with facial pain and sinus
disease. Initial encounter.

EXAM:
CT MAXILLOFACIAL WITHOUT CONTRAST
TECHNIQUE: Multidetector CT imaging of the maxillofacial structures was
performed. Multiplanar CT image reconstructions were also generated.
A small metallic BB was placed on the right temple in order to
reliably differentiate right from left.

[Series 3: facial st · axial · 0.32mm/px · z∈[-136,-2]mm · 16 of 73 slices shown, 20 images]
[im 3/73  brain]
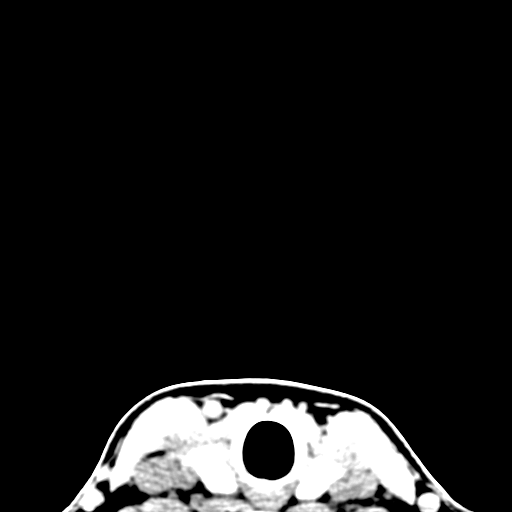
[im 3/73  bone]
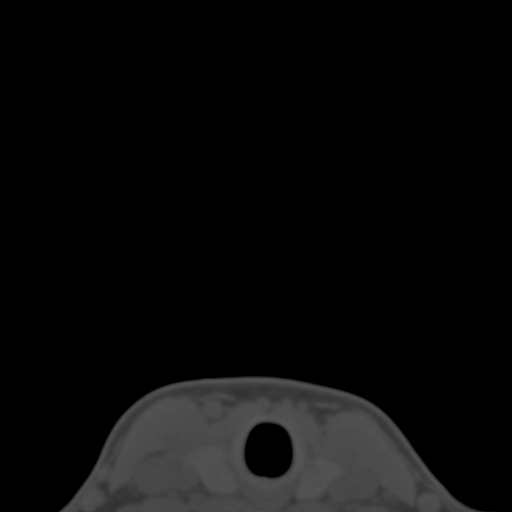
[im 8/73  bone]
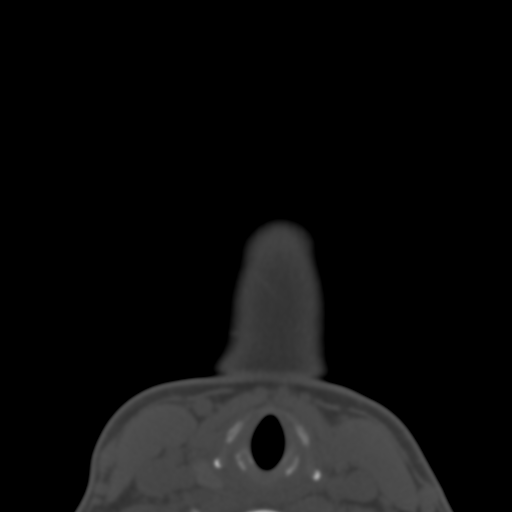
[im 13/73  bone]
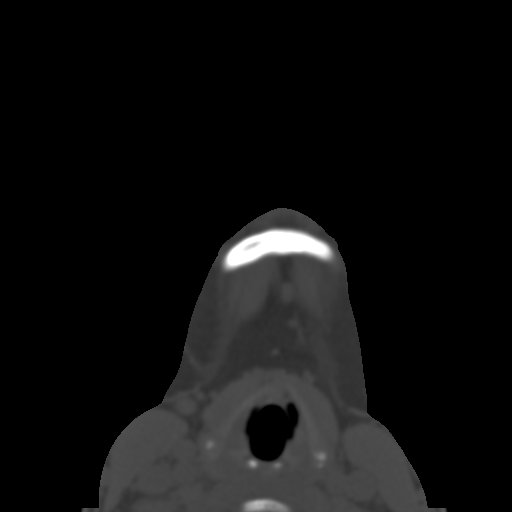
[im 18/73  bone]
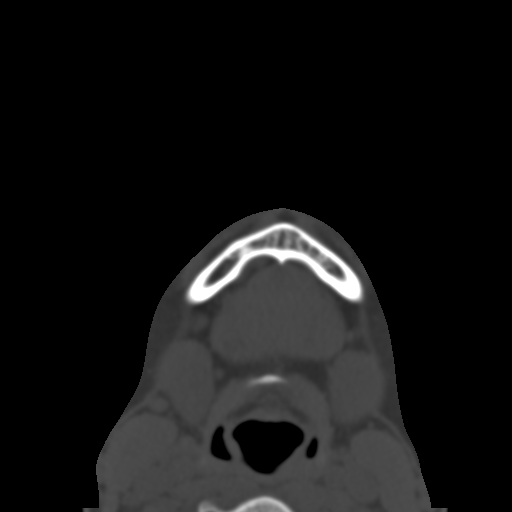
[im 20/73  brain]
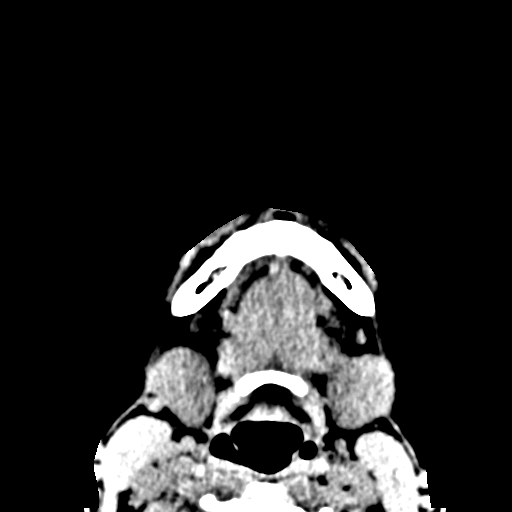
[im 20/73  bone]
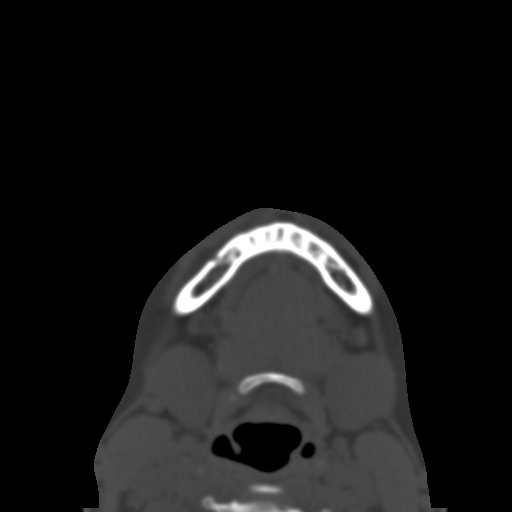
[im 25/73  bone]
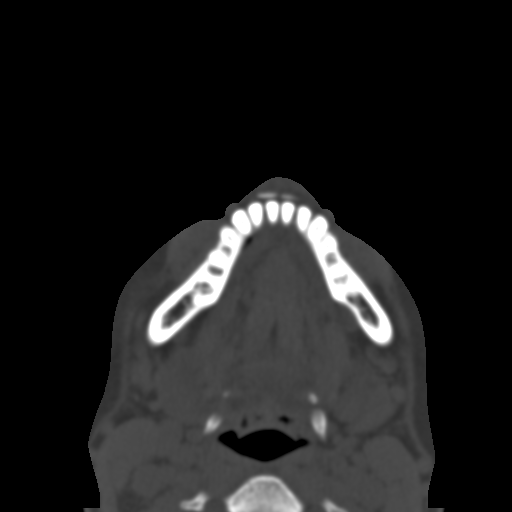
[im 30/73  bone]
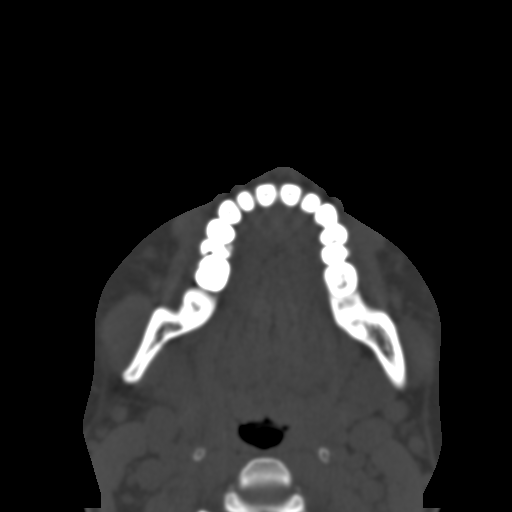
[im 35/73  bone]
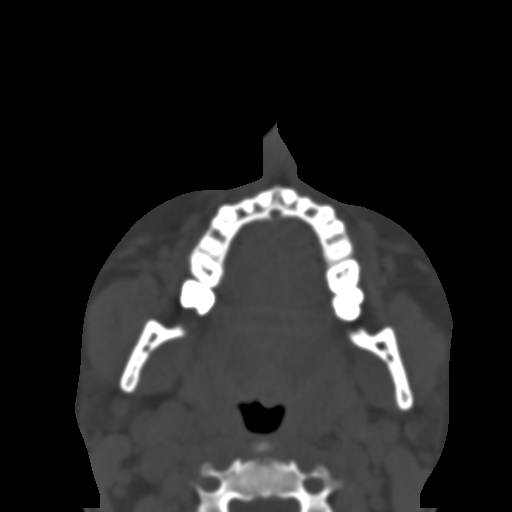
[im 38/73  brain]
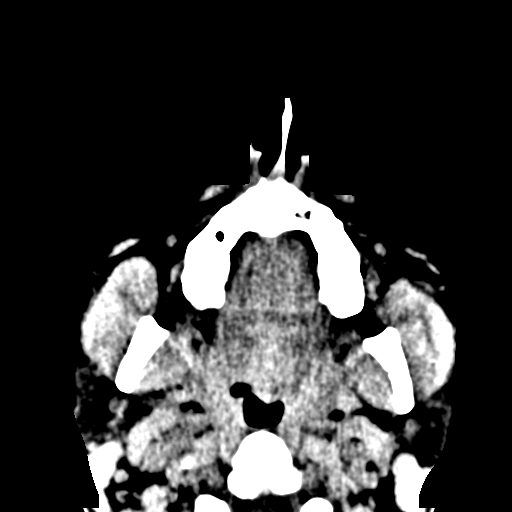
[im 38/73  bone]
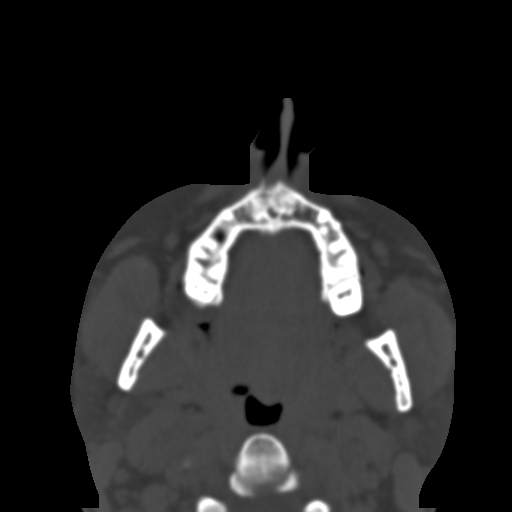
[im 43/73  bone]
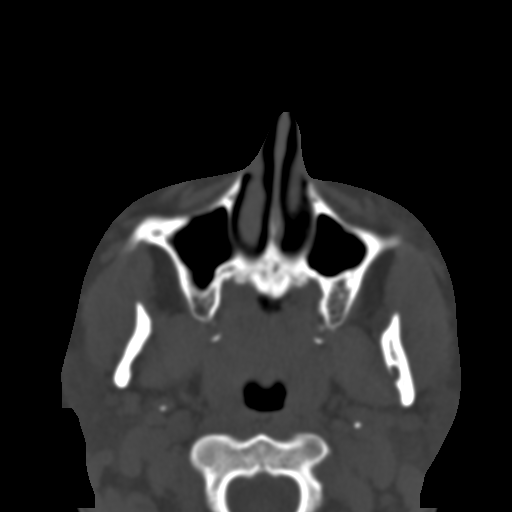
[im 48/73  bone]
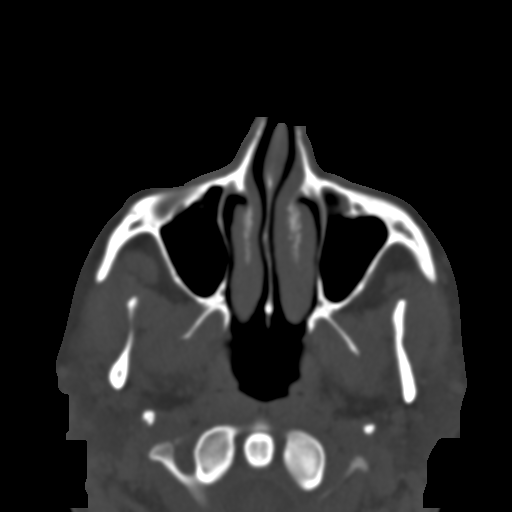
[im 53/73  bone]
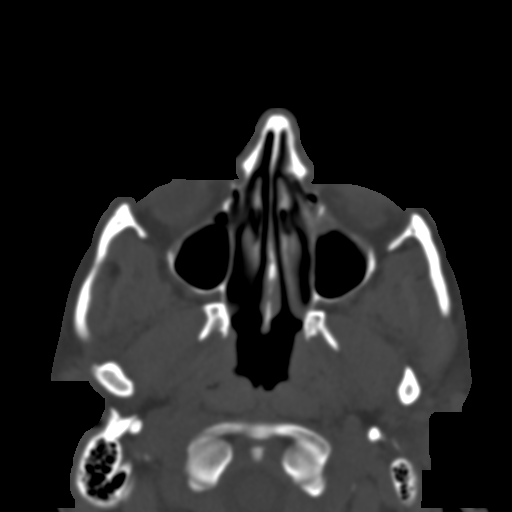
[im 55/73  brain]
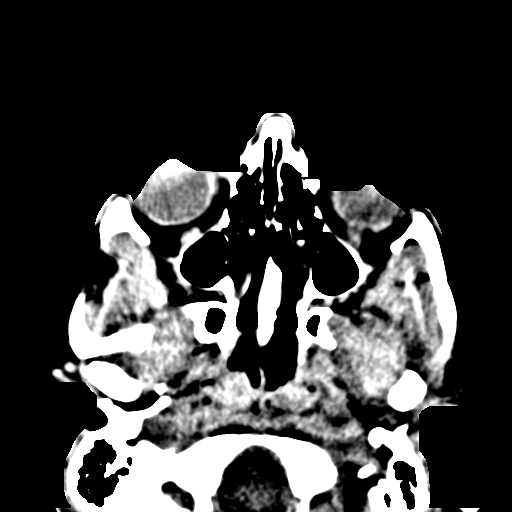
[im 55/73  bone]
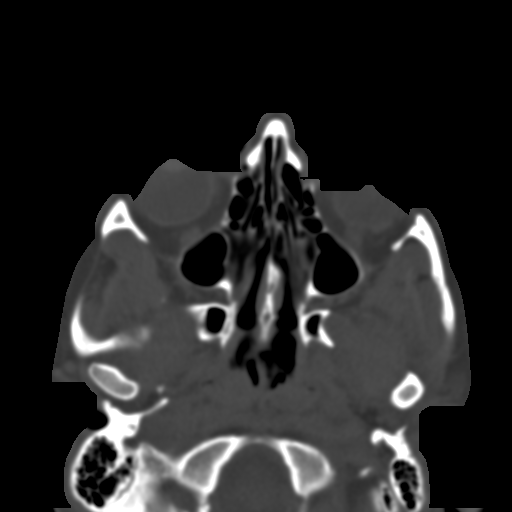
[im 60/73  bone]
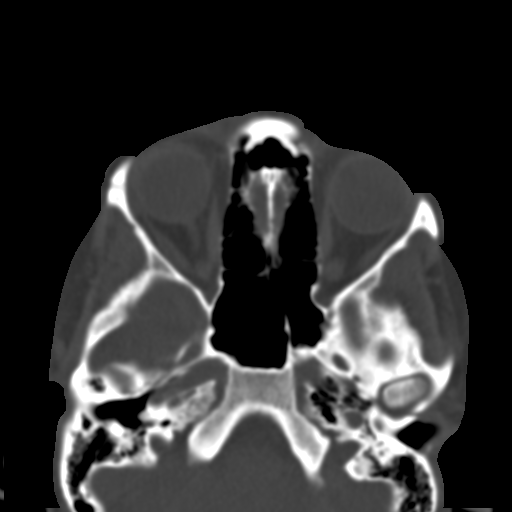
[im 65/73  bone]
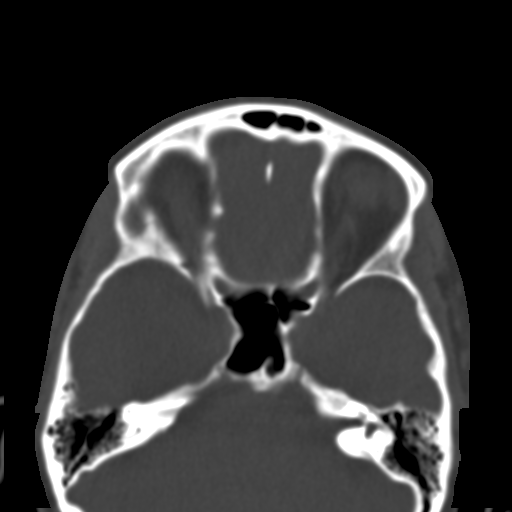
[im 70/73  bone]
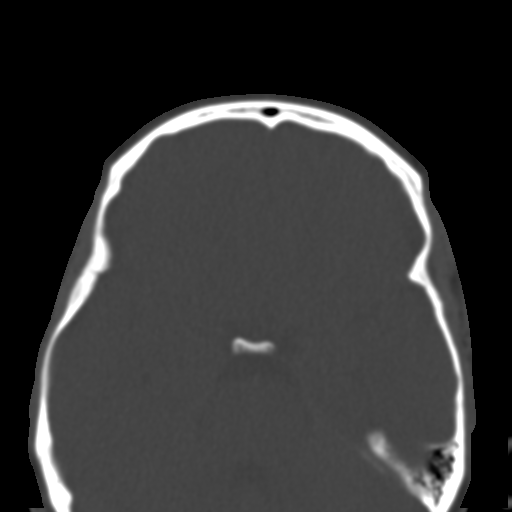

[16 of 30 positions shown; findings below may reference images not displayed]

FINDINGS: Negative visualized non contrast brain parenchyma, orbits soft
tissues, scalp soft tissues, and visible non contrast deep soft
tissue spaces of the face.

Tympanic cavities and mastoids are clear.

Sphenoid sinuses are clear.

Ethmoid air cells are clear.

Frontal sinuses are clear.

Right maxillary sinus is clear.

Mild mucosal thickening and trace bubbly opacity in the anterior
left maxillary sinus. This does affect the left OMC.

Mild nasal septal deviation. No acute osseous abnormality
identified.
IMPRESSION: Minimal to mild left maxillary sinus inflammatory changes.

## 2015-09-16 IMAGING — CR DG CHEST 2V
2 series · 2 of 2 positions shown · non-contrast
Comparison: None.

CLINICAL DATA: Fever. Chills. Nausea and vomiting. Duration: Today.

EXAM:
CHEST  2 VIEW

[w chest pa]
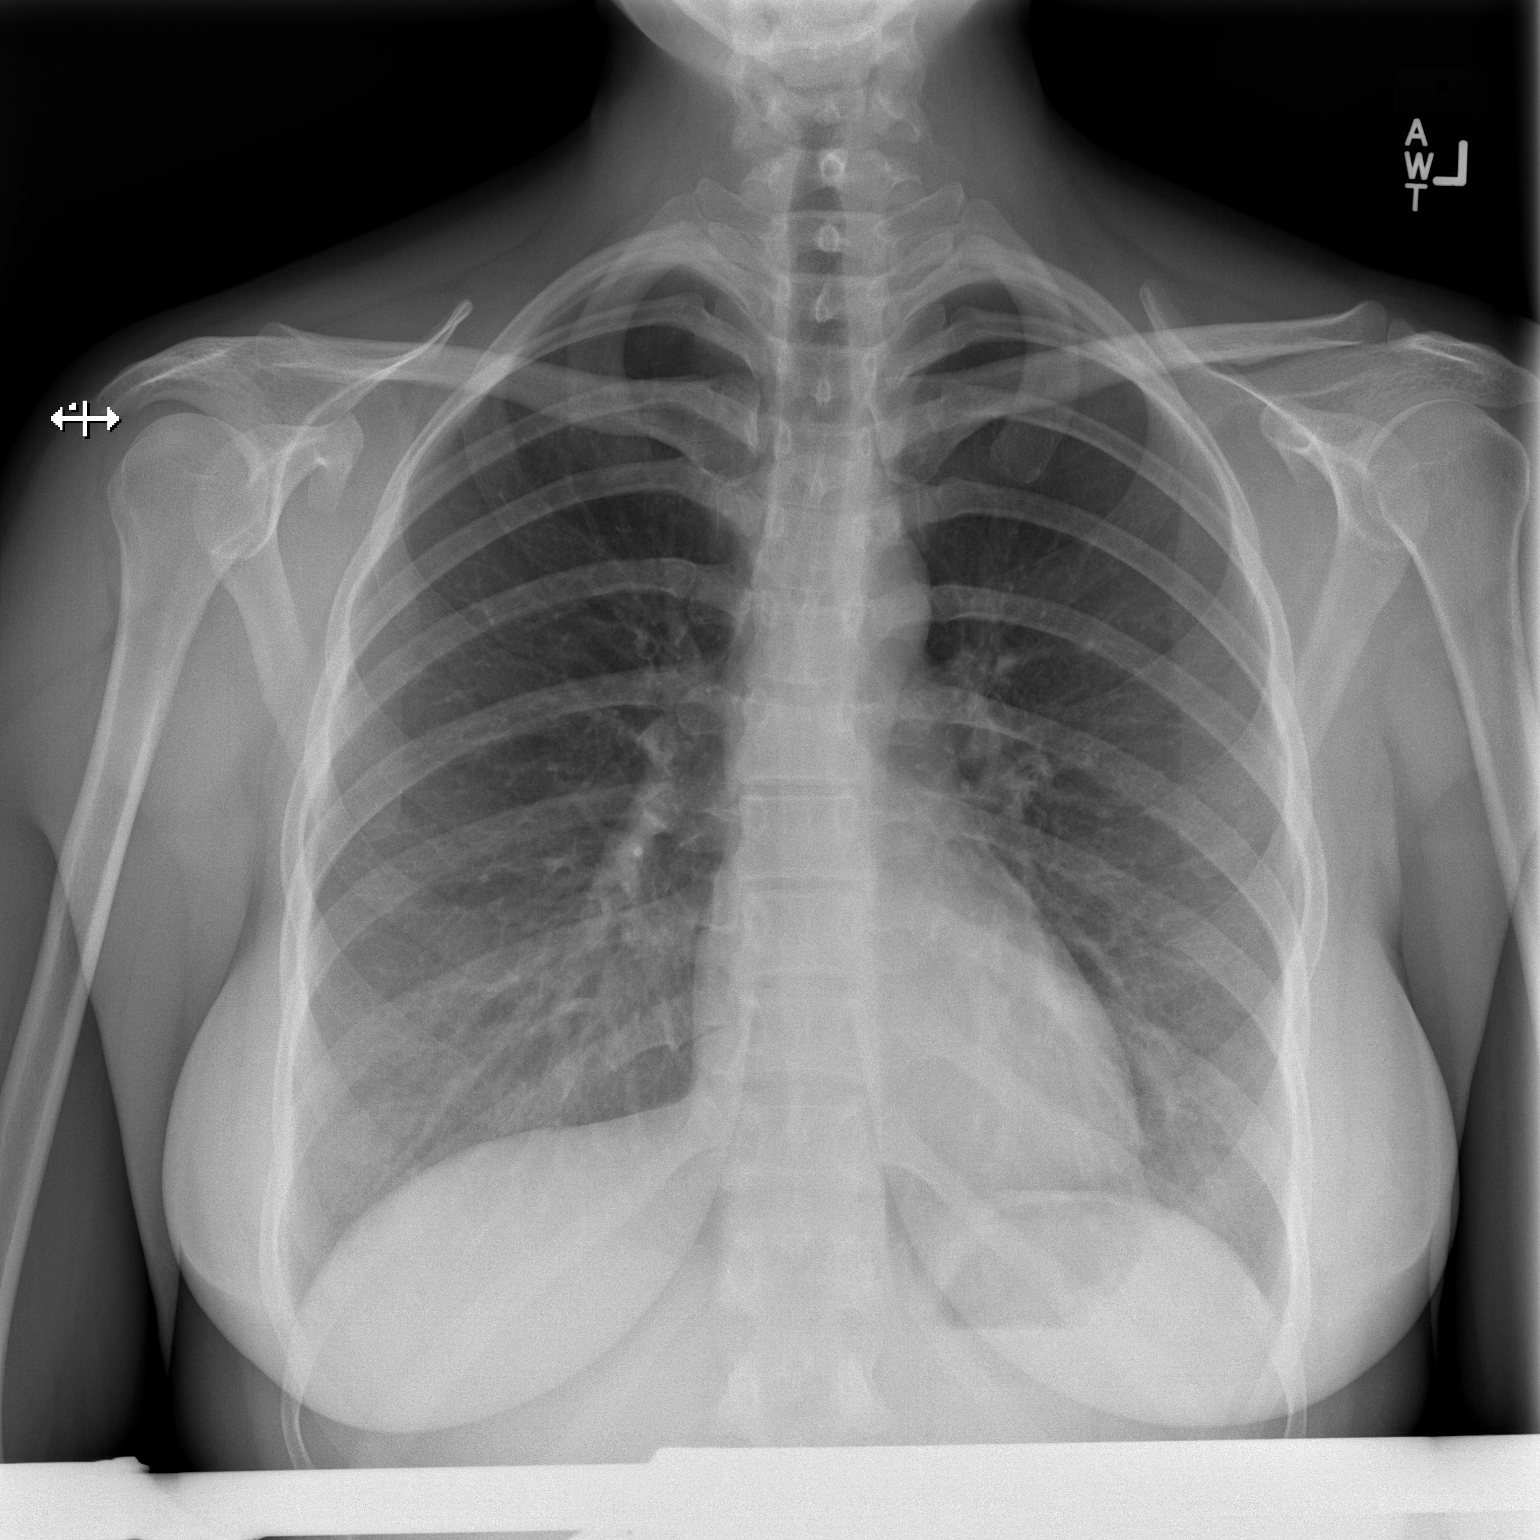

[w chest lat]
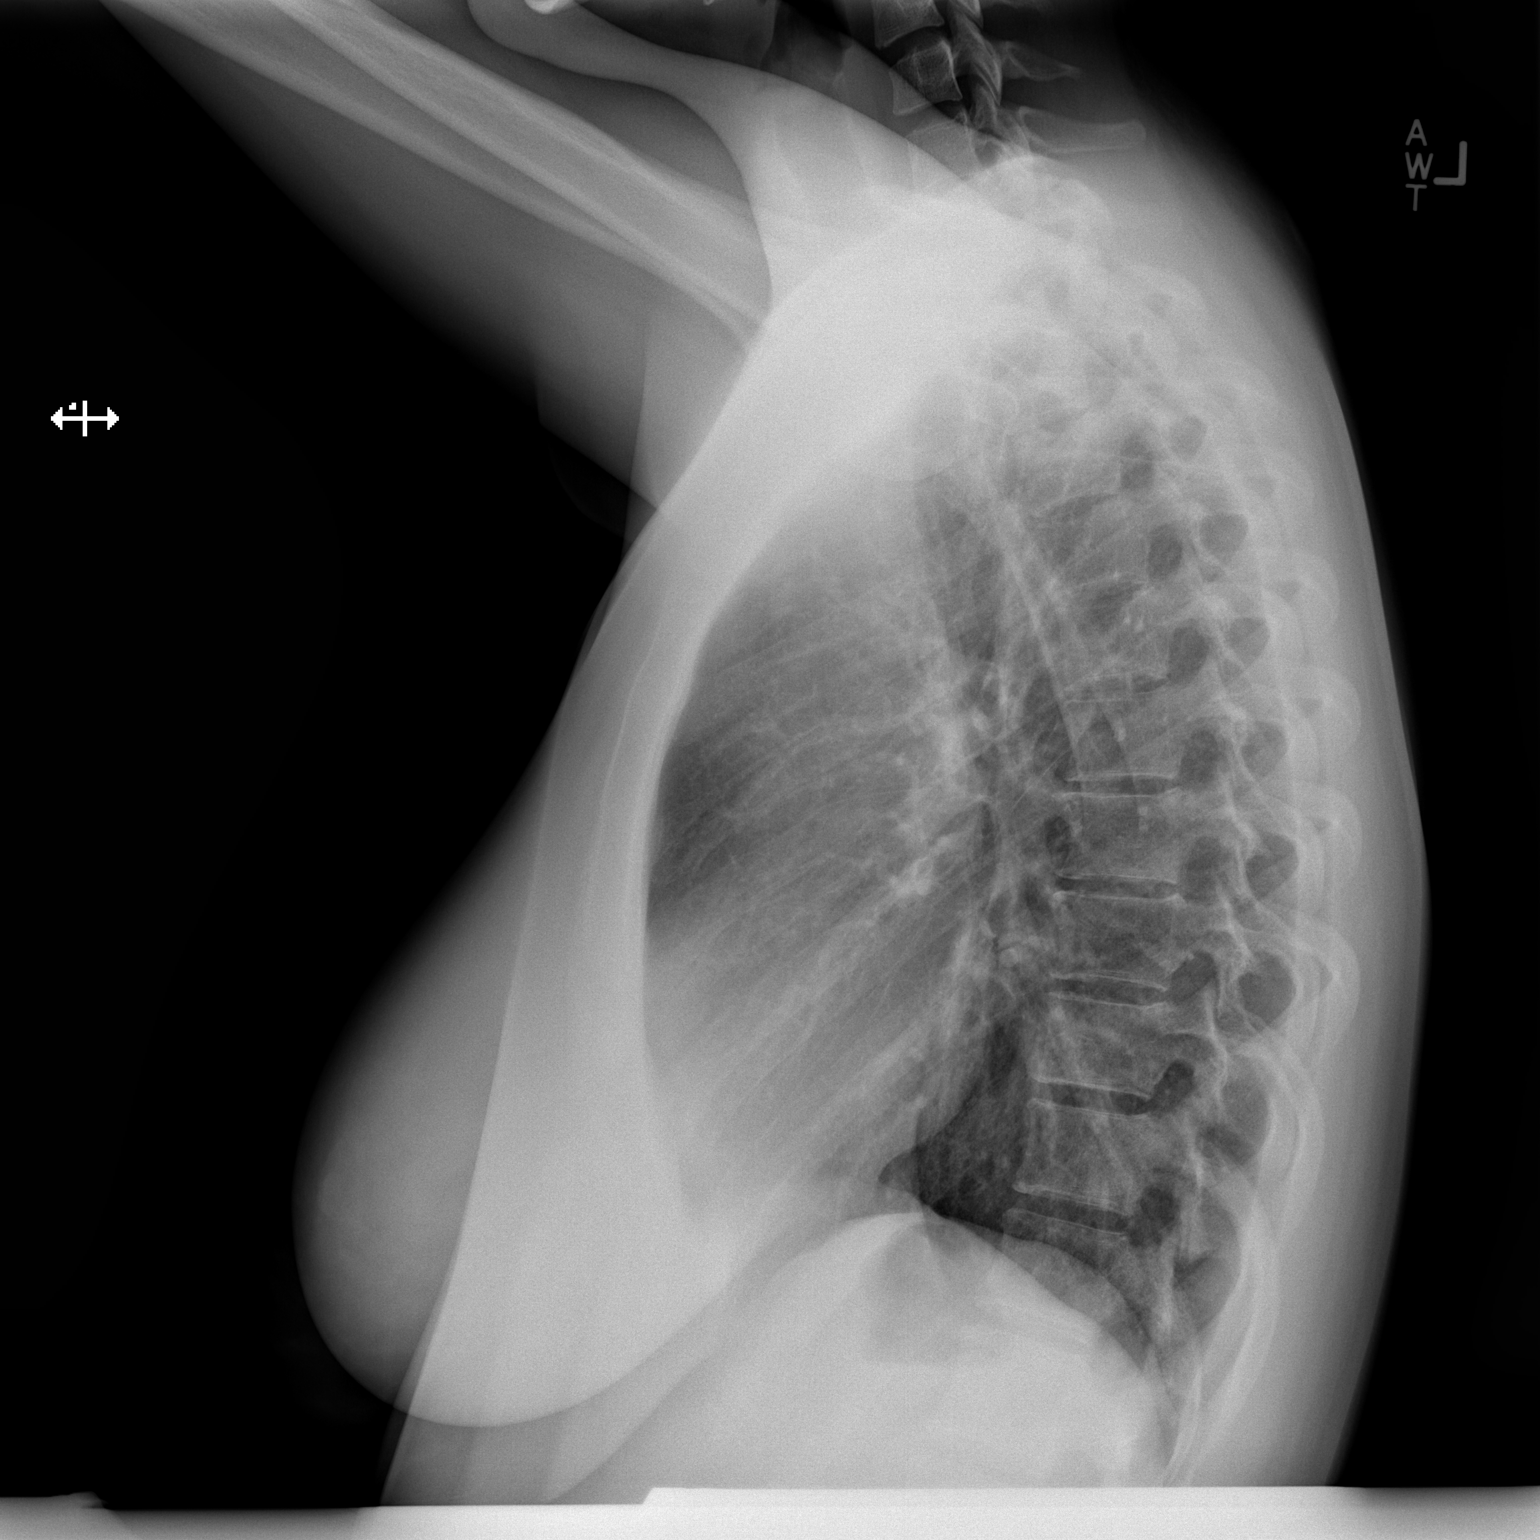

[2 of 2 positions shown; findings below may reference images not displayed]

FINDINGS: The lungs appear clear.  Cardiac and mediastinal contours normal.

No pleural effusion identified.
IMPRESSION: No active cardiopulmonary disease.
